# Patient Record
Sex: Female | Born: 1945 | Race: White | Hispanic: No | Smoking: Current every day smoker
Health system: Southern US, Community
[De-identification: ages and names within clinical notes are randomized; demographics above are authoritative.]

---

## 2014-03-28 ENCOUNTER — Other Ambulatory Visit: Payer: Self-pay | Admitting: Internal Medicine

## 2014-03-28 DIAGNOSIS — Z1231 Encounter for screening mammogram for malignant neoplasm of breast: Secondary | ICD-10-CM

## 2014-04-10 ENCOUNTER — Ambulatory Visit
Admission: RE | Admit: 2014-04-10 | Discharge: 2014-04-10 | Disposition: A | Discharge status: Home / Self Care | Payer: Medicare Other | Source: Ambulatory Visit | Attending: Internal Medicine | Admitting: Internal Medicine

## 2014-04-10 ENCOUNTER — Encounter (INDEPENDENT_AMBULATORY_CARE_PROVIDER_SITE_OTHER): Payer: Self-pay

## 2014-04-10 DIAGNOSIS — Z1231 Encounter for screening mammogram for malignant neoplasm of breast: Secondary | ICD-10-CM

## 2014-04-12 ENCOUNTER — Other Ambulatory Visit: Payer: Self-pay | Admitting: Internal Medicine

## 2014-04-23 ENCOUNTER — Other Ambulatory Visit: Payer: Self-pay | Admitting: Internal Medicine

## 2014-04-23 DIAGNOSIS — R928 Other abnormal and inconclusive findings on diagnostic imaging of breast: Secondary | ICD-10-CM

## 2014-05-07 ENCOUNTER — Other Ambulatory Visit: Payer: Self-pay | Admitting: Internal Medicine

## 2014-05-07 ENCOUNTER — Ambulatory Visit
Admission: RE | Admit: 2014-05-07 | Discharge: 2014-05-07 | Disposition: A | Discharge status: Home / Self Care | Payer: Medicare Other | Source: Ambulatory Visit | Attending: Internal Medicine | Admitting: Internal Medicine

## 2014-05-07 DIAGNOSIS — R928 Other abnormal and inconclusive findings on diagnostic imaging of breast: Secondary | ICD-10-CM

## 2014-05-21 ENCOUNTER — Encounter (INDEPENDENT_AMBULATORY_CARE_PROVIDER_SITE_OTHER): Payer: Self-pay

## 2014-05-21 ENCOUNTER — Ambulatory Visit
Admission: RE | Admit: 2014-05-21 | Discharge: 2014-05-21 | Disposition: A | Discharge status: Home / Self Care | Payer: Medicare Other | Source: Ambulatory Visit | Attending: Internal Medicine | Admitting: Internal Medicine

## 2014-05-21 DIAGNOSIS — R928 Other abnormal and inconclusive findings on diagnostic imaging of breast: Secondary | ICD-10-CM

## 2014-10-15 ENCOUNTER — Other Ambulatory Visit: Payer: Self-pay | Admitting: Internal Medicine

## 2014-10-15 DIAGNOSIS — R921 Mammographic calcification found on diagnostic imaging of breast: Secondary | ICD-10-CM

## 2014-11-04 IMAGING — MG MM RT BREAST BX W LOC DEV 1ST LESION IMAGE BX SPEC STEREO GUIDE
1 series · 1 of 1 positions shown · non-contrast
Comparison: Previous exams.

ADDENDUM:
Pathology revealed fibrocystic changes with calcifications in the
right breast. This was found to be concordant by Dr. Guangliang Lebogso Nkoa.
Pathology was relayed to the patient by telephone. The patient
reported doing well after the biopsy. Post biopsy instructions were
reviewed and her questions were answered. She was encouraged to call
She was asked to return in 6 months for diagnostic right
mammography.

Pathology results reported by Chai Tiger RN, BSN on May 22, 2014.
CLINICAL DATA: Suspicious right breast calcifications.
EXAM:
RIGHT BREAST STEREOTACTIC CORE NEEDLE BIOPSY

[R SPECIMEN]
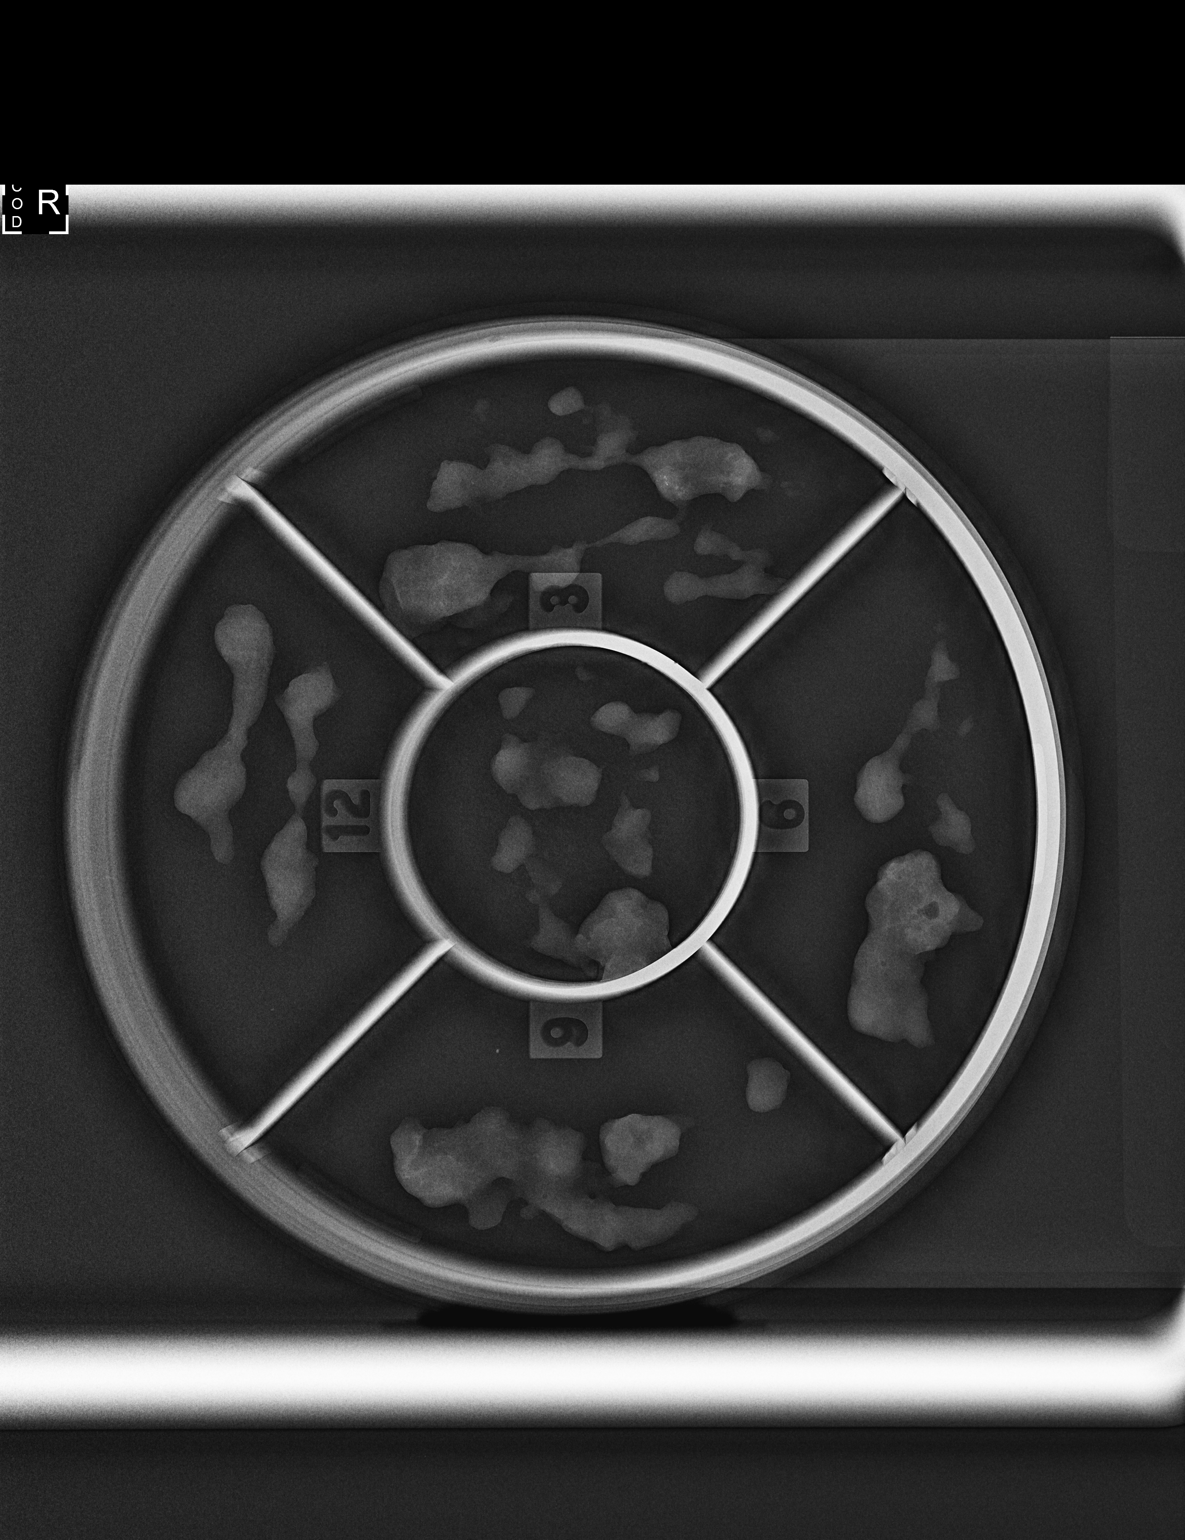

[1 of 1 positions shown; findings below may reference images not displayed]



Using sterile technique and 2% Lidocaine as local anesthetic, under
stereotactic guidance, a 9 gauge vacuum assisted core needle biopsy
device was used to perform core needle biopsy of calcifications
within the lateral right breast middle depth using a lateral
approach. Specimen radiograph was performed showing calcifications.
Specimens with calcifications are identified for pathology.

At the conclusion of the procedure, a coiled shaped tissue marker
clip was deployed into the biopsy cavity. Follow-up 2-view mammogram
confirmed clip to be located 2 cm lateral to the sampled
calcifications.
IMPRESSION: Stereotactic-guided biopsy of right breast calcifications. No
apparent complications.

Biopsy marking clip is located 2 cm lateral to the biopsied
calcifications.

## 2014-11-20 ENCOUNTER — Ambulatory Visit
Admission: RE | Admit: 2014-11-20 | Discharge: 2014-11-20 | Disposition: A | Discharge status: Home / Self Care | Payer: Medicare Other | Source: Ambulatory Visit | Attending: Internal Medicine | Admitting: Internal Medicine

## 2014-11-20 DIAGNOSIS — R921 Mammographic calcification found on diagnostic imaging of breast: Secondary | ICD-10-CM

## 2015-02-16 ENCOUNTER — Emergency Department (HOSPITAL_COMMUNITY): Payer: Medicare Other

## 2015-02-16 ENCOUNTER — Encounter (HOSPITAL_COMMUNITY): Payer: Self-pay | Admitting: Physical Medicine and Rehabilitation

## 2015-02-16 ENCOUNTER — Emergency Department (HOSPITAL_COMMUNITY)
Admission: EM | Admit: 2015-02-16 | Discharge: 2015-03-14 | Disposition: E | Payer: Medicare Other | Attending: Emergency Medicine | Admitting: Emergency Medicine

## 2015-02-16 ENCOUNTER — Encounter (HOSPITAL_COMMUNITY): Admission: EM | Disposition: E | Payer: Self-pay | Source: Home / Self Care

## 2015-02-16 ENCOUNTER — Ambulatory Visit (HOSPITAL_COMMUNITY): Admit: 2015-02-16 | Payer: Self-pay | Admitting: Interventional Cardiology

## 2015-02-16 DIAGNOSIS — I213 ST elevation (STEMI) myocardial infarction of unspecified site: Secondary | ICD-10-CM | POA: Diagnosis not present

## 2015-02-16 DIAGNOSIS — I219 Acute myocardial infarction, unspecified: Secondary | ICD-10-CM

## 2015-02-16 DIAGNOSIS — I2119 ST elevation (STEMI) myocardial infarction involving other coronary artery of inferior wall: Secondary | ICD-10-CM | POA: Diagnosis not present

## 2015-02-16 DIAGNOSIS — M549 Dorsalgia, unspecified: Secondary | ICD-10-CM | POA: Diagnosis not present

## 2015-02-16 DIAGNOSIS — I469 Cardiac arrest, cause unspecified: Secondary | ICD-10-CM

## 2015-02-16 DIAGNOSIS — I233 Rupture of cardiac wall without hemopericardium as current complication following acute myocardial infarction: Secondary | ICD-10-CM | POA: Insufficient documentation

## 2015-02-16 HISTORY — PX: LEFT HEART CATHETERIZATION WITH CORONARY ANGIOGRAM: SHX5451

## 2015-02-16 LAB — I-STAT CHEM 8, ED
BUN: 14 mg/dL (ref 6–23)
CHLORIDE: 107 mmol/L (ref 96–112)
CREATININE: 0.8 mg/dL (ref 0.50–1.10)
Calcium, Ion: 1.09 mmol/L — ABNORMAL LOW (ref 1.13–1.30)
Glucose, Bld: 355 mg/dL — ABNORMAL HIGH (ref 70–99)
HCT: 44 % (ref 36.0–46.0)
HEMOGLOBIN: 15 g/dL (ref 12.0–15.0)
Potassium: 5.6 mmol/L — ABNORMAL HIGH (ref 3.5–5.1)
SODIUM: 134 mmol/L — AB (ref 135–145)
TCO2: 8 mmol/L (ref 0–100)

## 2015-02-16 LAB — CBC WITH DIFFERENTIAL/PLATELET
Basophils Absolute: 0 10*3/uL (ref 0.0–0.1)
Basophils Relative: 0 % (ref 0–1)
Eosinophils Absolute: 0.2 10*3/uL (ref 0.0–0.7)
Eosinophils Relative: 1 % (ref 0–5)
HEMATOCRIT: 41.2 % (ref 36.0–46.0)
Hemoglobin: 12.4 g/dL (ref 12.0–15.0)
LYMPHS ABS: 6.8 10*3/uL — AB (ref 0.7–4.0)
Lymphocytes Relative: 32 % (ref 12–46)
MCH: 30.4 pg (ref 26.0–34.0)
MCHC: 30.1 g/dL (ref 30.0–36.0)
MCV: 101 fL — AB (ref 78.0–100.0)
Monocytes Absolute: 3.4 10*3/uL — ABNORMAL HIGH (ref 0.1–1.0)
Monocytes Relative: 16 % — ABNORMAL HIGH (ref 3–12)
NEUTROS ABS: 10.7 10*3/uL — AB (ref 1.7–7.7)
Neutrophils Relative %: 51 % (ref 43–77)
Platelets: 308 10*3/uL (ref 150–400)
RBC: 4.08 MIL/uL (ref 3.87–5.11)
RDW: 13.6 % (ref 11.5–15.5)
WBC: 21.1 10*3/uL — AB (ref 4.0–10.5)

## 2015-02-16 LAB — I-STAT CG4 LACTIC ACID, ED: Lactic Acid, Venous: 16.08 mmol/L (ref 0.5–2.0)

## 2015-02-16 LAB — I-STAT TROPONIN, ED: TROPONIN I, POC: 8.45 ng/mL — AB (ref 0.00–0.08)

## 2015-02-16 SURGERY — LEFT HEART CATHETERIZATION WITH CORONARY ANGIOGRAM
Anesthesia: LOCAL | Laterality: Bilateral

## 2015-02-16 MED ORDER — HEPARIN (PORCINE) IN NACL 2-0.9 UNIT/ML-% IJ SOLN
INTRAMUSCULAR | Status: AC
  Filled 2015-02-16: qty 500

## 2015-02-16 MED ORDER — ETOMIDATE 2 MG/ML IV SOLN
INTRAVENOUS | Status: AC
  Filled 2015-02-16: qty 20

## 2015-02-16 MED ORDER — EPINEPHRINE HCL 0.1 MG/ML IJ SOSY
PREFILLED_SYRINGE | INTRAMUSCULAR | Status: DC | PRN
  Administered 2015-02-16: 1 via INTRAVENOUS

## 2015-02-16 MED ORDER — ROCURONIUM BROMIDE 50 MG/5ML IV SOLN
INTRAVENOUS | Status: AC
  Filled 2015-02-16: qty 2

## 2015-02-16 MED ORDER — EPINEPHRINE HCL 0.1 MG/ML IJ SOSY
PREFILLED_SYRINGE | INTRAMUSCULAR | Status: AC
  Filled 2015-02-16: qty 30

## 2015-02-16 MED ORDER — ATROPINE SULFATE 1 MG/ML IJ SOLN
INTRAMUSCULAR | Status: AC | PRN
  Administered 2015-02-16 (×2): 1 mg via INTRAVENOUS

## 2015-02-16 MED ORDER — EPINEPHRINE HCL 0.1 MG/ML IJ SOSY
PREFILLED_SYRINGE | INTRAMUSCULAR | Status: AC
  Filled 2015-02-16: qty 10

## 2015-02-16 MED ORDER — BIVALIRUDIN 250 MG IV SOLR
INTRAVENOUS | Status: AC
Start: 2015-02-16 — End: 2015-02-16
  Filled 2015-02-16: qty 250

## 2015-02-16 MED ORDER — EPINEPHRINE HCL 0.1 MG/ML IJ SOSY
PREFILLED_SYRINGE | INTRAMUSCULAR | Status: AC | PRN
  Administered 2015-02-16: 1 via INTRAVENOUS

## 2015-02-16 MED ORDER — LIDOCAINE HCL (CARDIAC) 20 MG/ML IV SOLN
INTRAVENOUS | Status: AC
  Filled 2015-02-16: qty 5

## 2015-02-16 MED ORDER — SUCCINYLCHOLINE CHLORIDE 20 MG/ML IJ SOLN
INTRAMUSCULAR | Status: AC
  Filled 2015-02-16: qty 1

## 2015-02-16 MED ORDER — NOREPINEPHRINE BITARTRATE 1 MG/ML IV SOLN
0.0000 ug/min | Freq: Once | INTRAVENOUS | Status: AC
  Administered 2015-02-16: 10 ug/min via INTRAVENOUS

## 2015-02-16 MED ORDER — HEPARIN (PORCINE) IN NACL 2-0.9 UNIT/ML-% IJ SOLN
INTRAMUSCULAR | Status: AC
  Filled 2015-02-16: qty 1000

## 2015-02-16 MED ORDER — SUCCINYLCHOLINE CHLORIDE 20 MG/ML IJ SOLN
INTRAMUSCULAR | Status: AC | PRN
  Administered 2015-02-16: 100 mg via INTRAVENOUS

## 2015-02-16 MED ORDER — EPINEPHRINE HCL 0.1 MG/ML IJ SOSY
PREFILLED_SYRINGE | INTRAMUSCULAR | Status: AC | PRN
  Administered 2015-02-16 (×2): 1 via INTRAVENOUS

## 2015-02-16 MED ORDER — LIDOCAINE HCL (PF) 1 % IJ SOLN
INTRAMUSCULAR | Status: AC
  Filled 2015-02-16: qty 30

## 2015-02-16 MED FILL — Medication: Qty: 1 | Status: AC

## 2015-02-17 MED FILL — Sodium Chloride IV Soln 0.9%: INTRAVENOUS | Qty: 50 | Status: AC

## 2015-02-18 MED FILL — Medication: Qty: 1 | Status: AC

## 2015-02-19 LAB — POCT ACTIVATED CLOTTING TIME
ACTIVATED CLOTTING TIME: 270 s
Activated Clotting Time: 337 seconds

## 2015-02-27 ENCOUNTER — Telehealth: Payer: Self-pay | Admitting: Interventional Cardiology

## 2015-02-27 NOTE — Telephone Encounter (Signed)
Original D/C received from [facility], Dr. Katrinka Blazing signed called [facility] for pick up spoke with [place].

## 2015-03-05 ENCOUNTER — Telehealth: Payer: Self-pay | Admitting: Interventional Cardiology

## 2015-03-05 NOTE — Telephone Encounter (Signed)
error 

## 2015-03-14 NOTE — ED Notes (Addendum)
Pt presents to department via [facility] for evaluation of STEMI. Family called EMS for evaluation of altered mental status and back pain. Upon arrival pt noted to be pale, unable to palpate radial pulses. Unable to palpate peripheral pulses, CPR started @ 14:42.

## 2015-03-14 NOTE — Code Documentation (Signed)
cpr paused. Carotid pulse present.  To cath lab.

## 2015-03-14 NOTE — ED Notes (Signed)
Dr. Rosalia Hammers at bedside attempting to place central line.

## 2015-03-14 NOTE — Progress Notes (Signed)
While in Trauma C, pt coded right before transport, End Tidal attached to ET tube and was reading around 16 while being coded. Pt coded again while in Cath lab, but there was no adapter in code cart to hook up End tidal for monitoring. MD later called time of death, ventilator disconnected from ET tube and turned off. ET tube removed after okay from Dr. Katrinka Blazing.

## 2015-03-14 NOTE — Code Documentation (Signed)
Julia Powers paused.  Pulses returned.

## 2015-03-14 NOTE — ED Provider Notes (Addendum)
CSN: [id number]     Arrival date & time 02/23/2015  1444 History   None    Chief Complaint  Patient presents with  . Code STEMI  . Cardiac Arrest   Level V caveat secondary to acute need for intervention.  I was present on patient's arrival via EMS at the bedside.  (Consider location/radiation/quality/duration/timing/severity/associated sxs/prior Treatment) HPI  69 year old female unknown medical history presents today with reports that she had acute change in mental status and pain in her back. EMS reports that upon their arrival she was pale and they were unable to palpate radial pulses. They transmitted a prehospital EKG with ST elevation in inferior and lateral leads at a rate of 150. She was made a prehospital ST elevation MI alert.  History reviewed. No pertinent past medical history. History reviewed. No pertinent past surgical history. No family history on file. History  Substance Use Topics  . Smoking status: Current Every Day Smoker    Types: Cigarettes  . Smokeless tobacco: Not on file  . Alcohol Use: No   OB History    No data available     Review of Systems  Unable to perform ROS     Allergies  Review of patient's allergies indicates no known allergies.  Home Medications   Prior to Admission medications   Not on File   BP 182/69 mmHg  Pulse 163  Resp 19  Ht 5\' 6"  (1.676 m)  SpO2 82% Physical Exam  Constitutional: She appears well-developed and well-nourished.  HENT:  Head: Normocephalic and atraumatic.  Eyes:  Conjunctiva pale  Neck: Normal range of motion. Neck supple. No JVD present. No tracheal deviation present. No thyromegaly present.  Cardiovascular: Tachycardia present.   Pulmonary/Chest: Effort normal.  Musculoskeletal: Normal range of motion.  Pulses not palpable  Neurological: She is alert.  Patient combative striking at staff  Skin: There is pallor.  Nursing note and vitals reviewed.   ED Course  INTUBATION Date/Time: 02/23/2015  3:49 PM Performed by: Margarita Grizzle Authorized by: Margarita Grizzle Consent: The procedure was performed in an emergent situation. Indications: respiratory distress and  airway protection Intubation method: fiberoptic oral Patient status: paralyzed (RSI) Paralytic: succinylcholine Tube type: cuffed Number of attempts: 1 Ventilation between attempts: BVM Cricoid pressure: yes Cords visualized: yes Post-procedure assessment: chest rise and ETCO2 monitor Breath sounds: equal Cuff inflated: yes ETT to lip: 23 cm Tube secured with: ETT holder Chest x-Kentley Blyden interpreted by me. Chest x-Aidian Salomon findings: endotracheal tube too high Tube repositioned successfully: rt called to push tube to repositiion- patient en routte to cath lab.  CENTRAL LINE Date/Time: 02/23/2015 3:51 PM Performed by: Margarita Grizzle Authorized by: Margarita Grizzle Consent: The procedure was performed in an emergent situation. Time out: Immediately prior to procedure a "time out" was called to verify the correct patient, procedure, equipment, support staff and site/side marked as required. Indications: vascular access Patient sedated: no Preparation: skin prepped with 2% chlorhexidine Hand hygiene: hand hygiene performed prior to central venous catheter insertion Location details: right femoral Site selection rationale: code Patient position: flat Catheter type: triple lumen Pre-procedure: landmarks identified Ultrasound guidance: yes Sterile ultrasound techniques: sterile gel and sterile probe covers were used Number of attempts: 2 Successful placement: yes Post-procedure: line sutured and dressing applied Assessment: blood return through all ports and free fluid flow Patient tolerance: Patient tolerated the procedure well with no immediate complications   (including critical care time) Labs Review Labs Reviewed  CBC WITH DIFFERENTIAL/PLATELET - Abnormal; Notable for the  following:    WBC 21.1 (*)    MCV 101.0 (*)     All other components within normal limits  I-STAT CHEM 8, ED - Abnormal; Notable for the following:    Sodium 134 (*)    Potassium 5.6 (*)    Glucose, Bld 355 (*)    Calcium, Ion 1.09 (*)    All other components within normal limits  I-STAT TROPOININ, ED - Abnormal; Notable for the following:    Troponin i, poc 8.45 (*)    All other components within normal limits  I-STAT CG4 LACTIC ACID, ED - Abnormal; Notable for the following:    Lactic Acid, Venous 16.08 (*)    All other components within normal limits    Imaging Review No results found.   EKG Interpretation   Date/Time:  Sunday 2015/03/10 14:53:59 EST Ventricular Rate:  119 PR Interval:  122 QRS Duration: 72 QT Interval:  280 QTC Calculation: 394 R Axis:   99 Text Interpretation:  ** ** ACUTE MI / STEMI ** ** Confirmed by Ashleigh Arya MD,  Duwayne Heck [phone]) on 2015-03-10 3:47:23 PM      MDM   Final diagnoses:  ST elevation myocardial infarction (STEMI), unspecified artery  Cardiac arrest   Immediately after transfer from EMS stretcher to ED stretcher patient had a loss of consciousness. No pulses palpated. CPR was started. Please see complete CPR record. Patient intubated by myself. Please see intubation documentation. Patient with intermittent return of pulses. Dr. Katrinka Blazing, on for interventional cardiology at bedside. Patient had normal epidural placed after return of pulses. I obtained central line access in the right femoral vein. Patient with eventual return of pulses and taken to cardiac catheterization lab by Dr. Katrinka Blazing. CRITICAL CARE Performed by: Hilario Quarry Total critical care time: 45 Critical care time was exclusive of separately billable procedures and treating other patients. Critical care was necessary to treat or prevent imminent or life-threatening deterioration. Critical care was time spent personally by me on the following activities: development of treatment plan with patient and/or surrogate as well as  nursing, discussions with consultants, evaluation of patient's response to treatment, examination of patient, obtaining history from patient or surrogate, ordering and performing treatments and interventions, ordering and review of laboratory studies, ordering and review of radiographic studies, pulse oximetry and re-evaluation of patient's condition.    Hilario Quarry, MD 03/10/2015 1553  Margarita Grizzle, MD 03/05/15 [phone]

## 2015-03-14 NOTE — ED Notes (Signed)
Pt to cath lab.

## 2015-03-14 NOTE — CV Procedure (Signed)
Left Heart Catheterization with Coronary Angiography Report  Julia Powers  69 y.o.  female Jun 17, 1946  Procedure Date: 14-Mar-2015 Referring Physician: None Primary Cardiologist: None  INDICATIONS: Cardiogenic shock with inferolateral ST elevation, Prolonged CPR, and preceding history of back pain 3-5 days.  PROCEDURE: 1. Emergency coronary angiography; 2. Left ventriculography; 3. Left heart catheterization; 4. Attempted angioplasty of the very distal obtuse marginal, unsuccessful  CONSENT:  Emergency consent was used. I explained to the family prior to bringing the patient to the Cath Lab that this was a salvage situation hoping to extend the patient's life. I explained that her hospital mortality was upwards of 70-80%.  PROCEDURE TECHNIQUE:  The patient brought to the catheterization laboratory in cardiogenic shock, without palpable pulses, but with a rhythm. Epinephrine was being used intermittently to achieve blood elevation to a point that we could feel pulses. CPR was performed in the emergency room for 30 minutes before attempting to come to the cath lab. In the catheterization laboratory epinephrine was administered and allowed [place] to be able to fill up also noted to insert a sheath into the left femoral artery. We did not use a right femoral artery because the emergency room staff and use a right femoral vein as an access site for their CPR attempts.  After achieving the arterial sheath placement, we noted blood pressures in the 60 mmHg range. The patient was unresponsive. Her skin was mottled and cyanotic.  We used a multipurpose catheter take a quick picture of the left coronary. Diffuse multifocal disease was noted throughout her left coronary system. There was a totally occluded diagonal. There was moderate obstruction in the mid proximal and distal LAD but no total occlusion. The circumflex system was patent with the exception of the proximal portion of the most distal  obtuse marginal. This appeared to be a fresh occlusion. The right coronary artery was then angiogram as we felt that it was the likely source of the patient's infarct pattern. The right coronary was widely patent with scattered less than critical stenoses noted throughout. The anatomy did not support the patient's cardiogenic shock state. With ongoing ST elevation we briefly attempted to perform PTCA of the distal obtuse marginal. This was preceded by the administration of bivalirudin. We will unable to cross the stenosis. Attempts at angioplasty were aborted.  We performed a left ventriculogram using the multipurpose catheter and hand injection which demonstrated a free wall rupture into the pericardium. While I was speaking with the cardiac surgeon, Dr. Donata Clay on the phone, the patient developed a brady-asystolic rhythm from which we were unable to resuscitate back to a supportable blood pressure. We therefore were unable to attempt pericardiocentesis without CPR and with a measurable blood pressure. Further attempts were aborted.  We pronounced the patient dead at 4:41 PM.    CONTRAST:  Total of  150 cc.  COMPLICATIONS:  Bradycardia asystolic cardiac arrest emanating from a state of chronic severe cardiogenic shock with pressures less than 60 for greater than an hour and a half.   HEMODYNAMICS:  Aortic pressure 58/41 mmHg; LV pressure 46/14 mmHg  ANGIOGRAPHIC DATA:   The left main coronary artery is patent.  The left anterior descending artery is patent. The first diagonal is totally occluded. Diffuse disease is noted throughout the mid and distal LAD but no total occlusion or high-grade obstruction was seen. The mid LAD was felt to contain between 50 and 70% obstruction. The vessels were very tortuous.  The left  circumflex artery is patent. A small first obtuse marginal branch arises proximally. A larger ranching second obtuse marginal arises from the mid vessel. Diffuse disease is noted in  the continuation of the circumflex. There is an 80% stenosis before the larger of the third obtuse marginal. The third obtuse marginal is totally occluded in the proximal segment..  The right coronary artery is dominant. Irregularities are noted throughout the mid vessel. The PDA contains 50-60% obstruction before the continuation. Flow throughout the right coronary is brisk. Early venous phase filling is noted. Significant myocardial blush is noted.Marland Kitchen   LEFT VENTRICULOGRAM:  Left ventricular angiogram was done in the 30 RAO projection and revealed surprisingly vigorous left ventricular function especially the anterior wall. The mid to distal inferior wall was akinetic and there was evidence of free wall rupture with contrast extravasation into the pericardial space. Estimated ejection fraction was 35-40%.   IMPRESSIONS:  1. Free wall rupture of the inferior myocardium resulting in prolonged cardiogenic shock. Myocardial infarction occurred >48-72 hours prior to presentation. 2. Total occlusion of a moderate third obtuse marginal branch resulting in a very late presenting inferolateral wall infarct followed by myocardial rupture. Angioplasty attempt was brief and was performed prior to diagnosing the ruptured myocardium with left ventriculography. 3. Otherwise  moderate LAD, circumflex, and right coronary disease.   RECOMMENDATION:  The patient was pronounced at at 4:41 PM due to an inferolateral myocardial infarction with mechanical complication of free wall rupture.

## 2015-03-14 NOTE — ED Notes (Signed)
I Stat Lactic Acid, and I Stat Chem 8  results read at bedside to Dr. Rosalia Hammers, and Dr. Katrinka Blazing in Code.

## 2015-03-14 NOTE — ED Notes (Signed)
Julia Powers paused.  No pulse.  Amp epi pushed.  Julia Powers restarted.

## 2015-03-14 NOTE — Progress Notes (Signed)
Family in 2H waiting area  Vickii Penna 3:55 PM

## 2015-03-14 NOTE — Code Documentation (Signed)
Julia Powers paused.  No pulse.  Julia Powers resumed.

## 2015-03-14 NOTE — Progress Notes (Signed)
Pt brother, Mr. Dannielle Huh and his wife Ms. Dorina Hoyer is in consult room B.  Gala Romney, Chaplain

## 2015-03-14 NOTE — Code Documentation (Signed)
CPR stopped, unable to palpate peripheral pulses, PEA on monitor. CPR continued.

## 2015-03-14 NOTE — Code Documentation (Signed)
RSI successful, positive color change, bilateral breath sounds, 8.0 ET tube, 21 @ lip.

## 2015-03-14 NOTE — Code Documentation (Addendum)
Julia Powers stopped.  No pulse.  Julia Powers resumed.

## 2015-03-14 NOTE — ED Notes (Signed)
Cardiology at bedside.

## 2015-03-14 NOTE — Code Documentation (Signed)
Pulses returned. Sinus tachycardia on monitor.

## 2015-03-14 NOTE — ED Notes (Signed)
No pulse.  Samuel Bouche started.

## 2015-03-14 NOTE — ED Notes (Addendum)
I Stat CTnl (Trop) results read at bedside to Dr. Katrinka Blazing, and shown to Dr. Rosalia Hammers

## 2015-03-14 NOTE — Code Documentation (Signed)
Samuel Bouche paused.  No pulse found.  Dr Blinda Leatherwood speaking with family.

## 2015-03-14 NOTE — Progress Notes (Signed)
Chaplain present with the family after they received notification of Julia Powers passing.  Cath team member and Chaplain walked family to the cath lab to see and be with Julia Powers.   Chaplain provided emotional and spiritual support, facilitated story-telling as family expressed grief.   Pt placement card given to pt's brother Mr. Julia Powers as [place].  Family is now off the unit and heading home. They are not expecting any more visitors.   Vickii Penna 03/10/2015 6:17 PM

## 2015-03-14 NOTE — Progress Notes (Signed)
Chaplain present as MD updated family on pt's condition.  Mrs. Sroufe has been living with her brother since 2014. Her husband had a stroke and now lives in a special facility in [place]; her brother and his wife preferred that she didn't live alone.   Family noted that pt doesn't have any heart issues that they know about.   They have began calling other family members in [place], she has additional family and siblings in [place].   Family awaiting to see her when available; in consult B.   Gala Romney, Chaplain Feb 22, 2015

## 2015-03-14 DEATH — deceased

## 2015-08-02 IMAGING — CR DG CHEST 1V PORT
1 series · 1 of 1 positions shown · non-contrast
Comparison: None.

CLINICAL DATA: 68-year-old female history of myocardial infarction
and cardiac arrest.

EXAM:
PORTABLE CHEST - 1 VIEW

[AP]
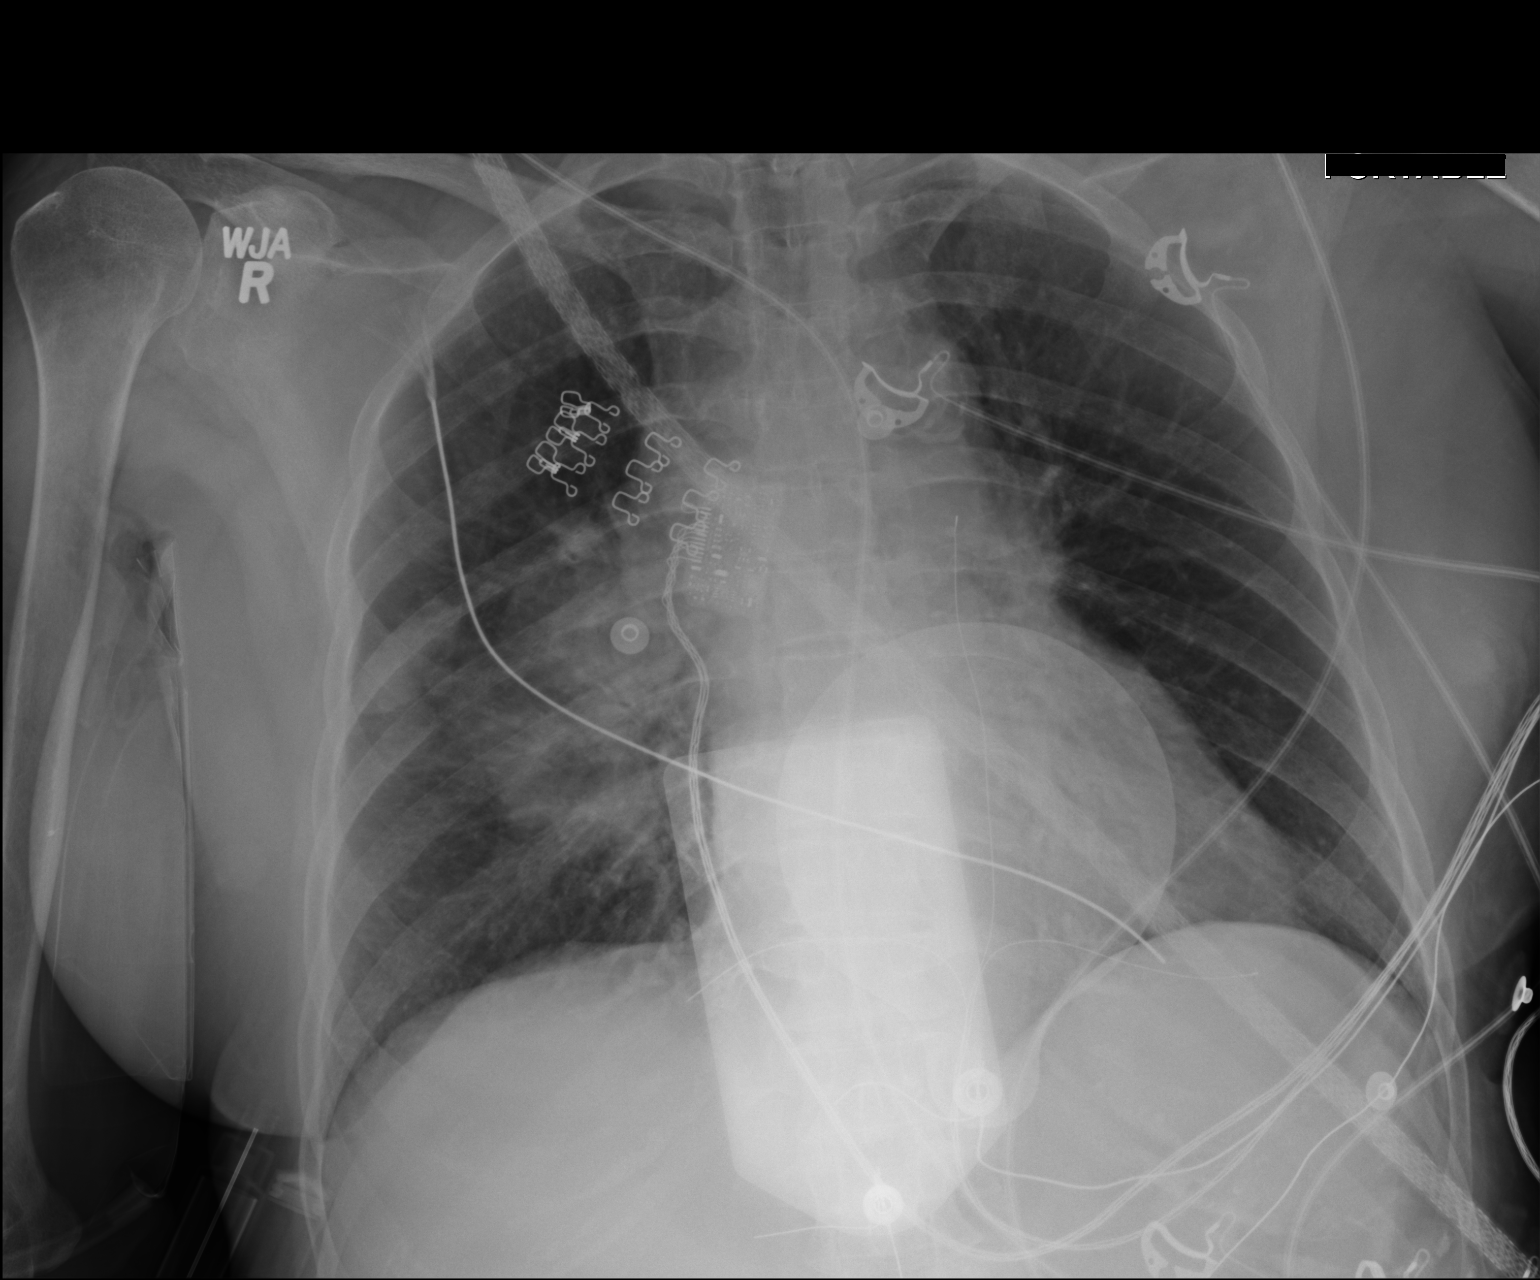

[1 of 1 positions shown; findings below may reference images not displayed]

FINDINGS: Cardiomediastinal silhouette within normal limits.

Airspace disease overlying the right hilar region.  No pneumothorax.

No pleural effusion.

Endotracheal tube above the level of the clavicles, approximately
10.6 cm from the carina.

Defibrillator pads overlie the chest.

EKG leads overlying the chest.

No displaced fracture.
IMPRESSION: Airspace disease in the right hilar region may reflect contusion
(given history of cardiac arrest and defibrillator pads), sequela of
aspiration, or developing pneumonia.

Endotracheal tube projects above the level the clavicles
approximately 10.6 cm above the carina. This may be advanced for
better positioning.

Defibrillator pads and EKG leads overlying the chest.

These results were called by telephone at the time of interpretation
on 02/16/2015 at [DATE] to Cath Lab Tech Mr Jhemboy Padam, who
verbally acknowledged these results.
